# Patient Record
Sex: Male | Born: 1935 | Race: White | Hispanic: No | Marital: Single
Health system: Southern US, Community
[De-identification: ages and names within clinical notes are randomized; demographics above are authoritative.]

---

## 2013-06-03 LAB — CBC WITH DIFFERENTIAL/PLATELET
Eosinophil #: 0 10*3/uL (ref 0.0–0.7)
Eosinophil %: 0.6 %
HCT: 37.3 % — ABNORMAL LOW (ref 40.0–52.0)
MCHC: 34.6 g/dL (ref 32.0–36.0)
Platelet: 193 10*3/uL (ref 150–440)
RBC: 4.21 10*6/uL — ABNORMAL LOW (ref 4.40–5.90)
RDW: 12.9 % (ref 11.5–14.5)

## 2013-06-03 LAB — COMPREHENSIVE METABOLIC PANEL
Albumin: 4.5 g/dL (ref 3.4–5.0)
Alkaline Phosphatase: 66 U/L (ref 50–136)
Anion Gap: 5 — ABNORMAL LOW (ref 7–16)
Bilirubin,Total: 0.4 mg/dL (ref 0.2–1.0)
Co2: 28 mmol/L (ref 21–32)
Glucose: 129 mg/dL — ABNORMAL HIGH (ref 65–99)
Potassium: 4.6 mmol/L (ref 3.5–5.1)
Sodium: 135 mmol/L — ABNORMAL LOW (ref 136–145)
Total Protein: 7.2 g/dL (ref 6.4–8.2)

## 2013-06-03 LAB — ACETAMINOPHEN LEVEL: Acetaminophen: <2 ug/mL

## 2013-06-03 LAB — TROPONIN I: Troponin-I: <0.02 ng/mL

## 2013-06-04 ENCOUNTER — Inpatient Hospital Stay: Payer: Self-pay | Admitting: Internal Medicine

## 2013-06-04 LAB — URINALYSIS, COMPLETE
Blood: NEGATIVE
Glucose,UR: NEGATIVE mg/dL (ref 0–75)
Leukocyte Esterase: NEGATIVE
Nitrite: NEGATIVE
Ph: 7 (ref 4.5–8.0)
Protein: NEGATIVE
RBC,UR: 12 /HPF (ref 0–5)
Squamous Epithelial: 1
WBC UR: 6 /HPF (ref 0–5)

## 2013-06-04 LAB — DRUG SCREEN, URINE
Amphetamines, Ur Screen: NEGATIVE (ref ?–1000)
Barbiturates, Ur Screen: NEGATIVE (ref ?–200)
Opiate, Ur Screen: POSITIVE (ref ?–300)
Phencyclidine (PCP) Ur S: NEGATIVE (ref ?–25)

## 2013-06-06 LAB — BASIC METABOLIC PANEL
BUN: 19 mg/dL — ABNORMAL HIGH (ref 7–18)
Chloride: 97 mmol/L — ABNORMAL LOW (ref 98–107)
Co2: 27 mmol/L (ref 21–32)
EGFR (African American): >60
EGFR (Non-African Amer.): >60
Osmolality: 266 (ref 275–301)
Potassium: 3.7 mmol/L (ref 3.5–5.1)
Sodium: 130 mmol/L — ABNORMAL LOW (ref 136–145)

## 2013-06-06 LAB — CBC WITH DIFFERENTIAL/PLATELET
Basophil #: 0 10*3/uL (ref 0.0–0.1)
Basophil %: 0.5 %
Eosinophil %: 1.3 %
Lymphocyte #: 0.8 10*3/uL — ABNORMAL LOW (ref 1.0–3.6)
Lymphocyte %: 12.6 %
Monocyte #: 0.6 x10 3/mm (ref 0.2–1.0)
Neutrophil #: 4.6 10*3/uL (ref 1.4–6.5)
Neutrophil %: 75.6 %
Platelet: 155 10*3/uL (ref 150–440)
RBC: 4.42 10*6/uL (ref 4.40–5.90)
RDW: 12.7 % (ref 11.5–14.5)
WBC: 6 10*3/uL (ref 3.8–10.6)

## 2013-06-07 ENCOUNTER — Inpatient Hospital Stay: Payer: Self-pay | Admitting: Psychiatry

## 2013-06-08 LAB — CULTURE, BLOOD (SINGLE)

## 2013-06-15 LAB — VALPROIC ACID LEVEL: Valproic Acid: 9 ug/mL — ABNORMAL LOW

## 2014-04-27 IMAGING — CT CT HEAD WITHOUT CONTRAST
1 series · 16 of 30 positions shown, 20 images · non-contrast
Comparison: None.

CLINICAL DATA: 78-year- male with altered mental status. Initial
encounter.

EXAM:
CT HEAD WITHOUT CONTRAST
TECHNIQUE: Contiguous axial images were obtained from the base of the skull
through the vertex without intravenous contrast.

[Series 2: head wo · axial · 0.44mm/px · z∈[+1287,+1431]mm · 16 of 36 slices shown, 20 images]
[im 2/36  brain]
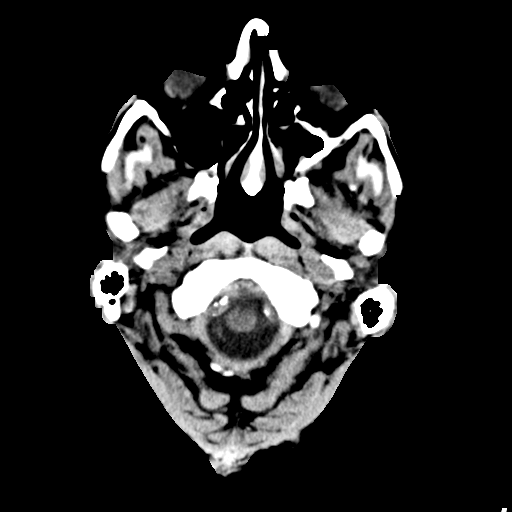
[im 2/36  bone]
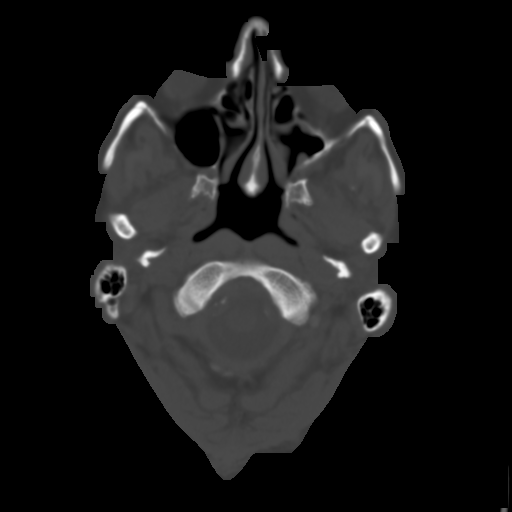
[im 4/36  brain]
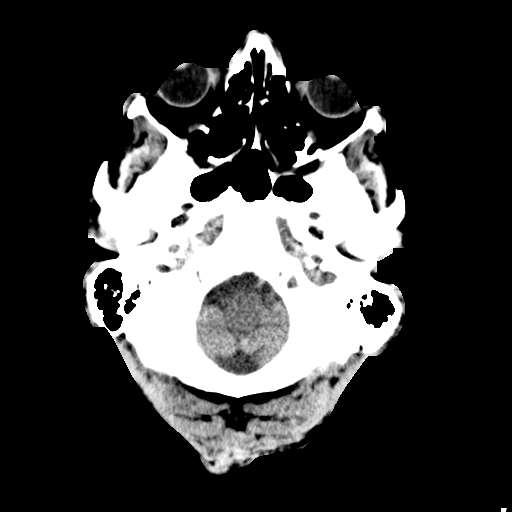
[im 7/36  brain]
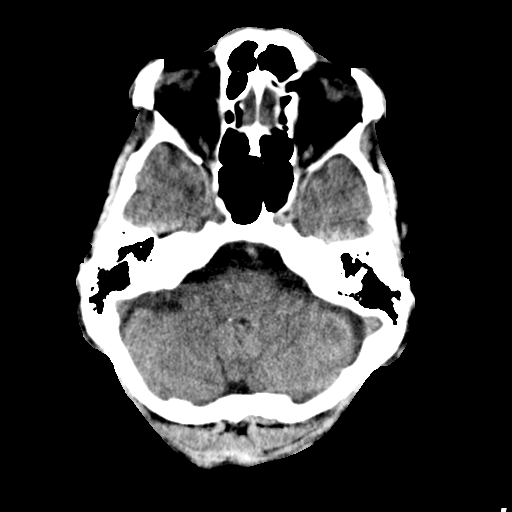
[im 9/36  brain]
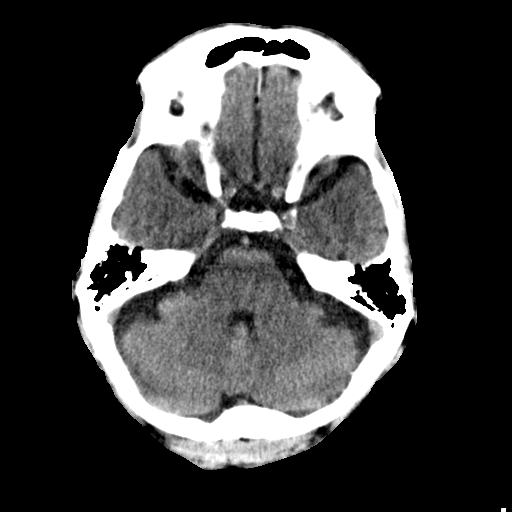
[im 10/36  brain]
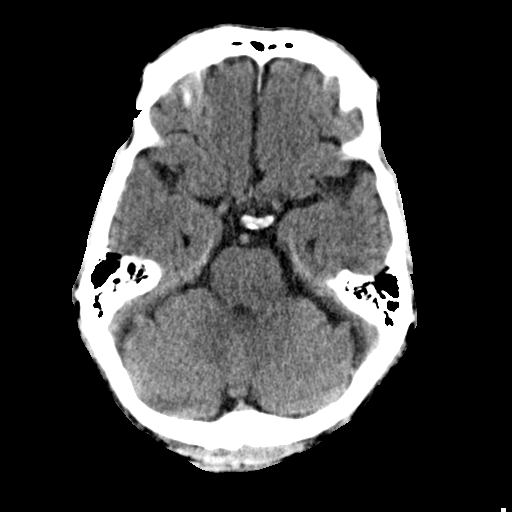
[im 10/36  bone]
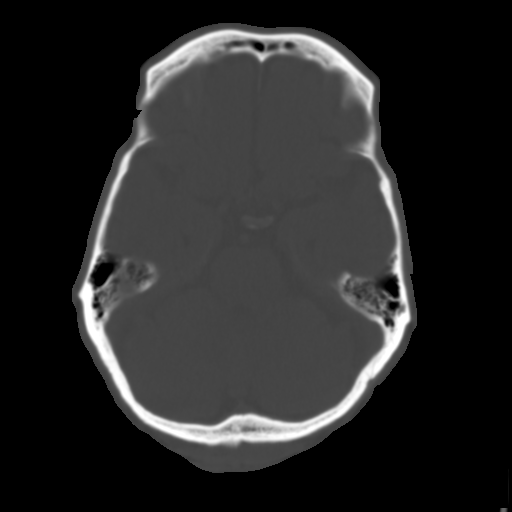
[im 13/36  brain]
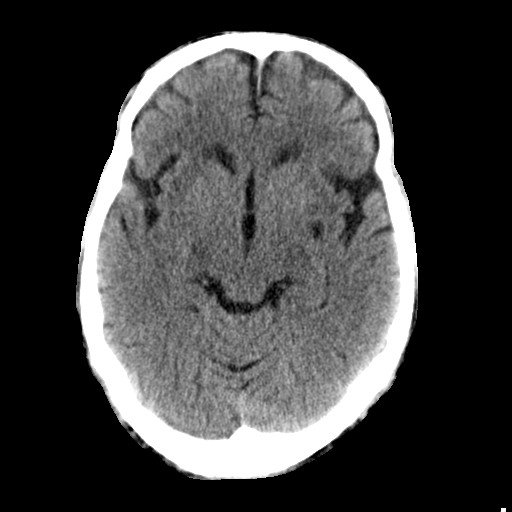
[im 15/36  brain]
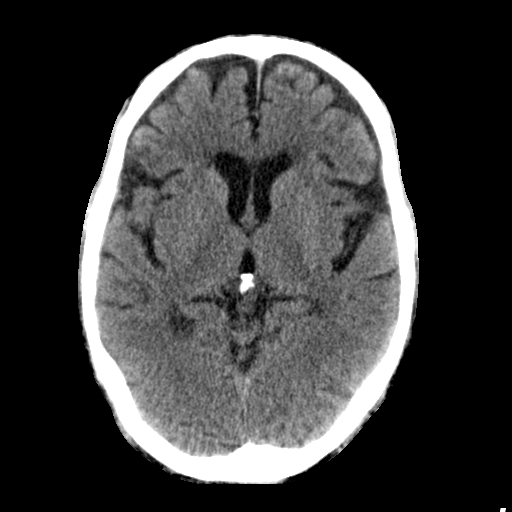
[im 17/36  brain]
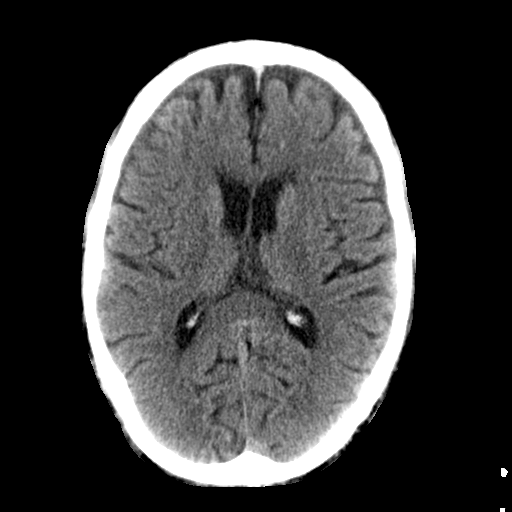
[im 19/36  brain]
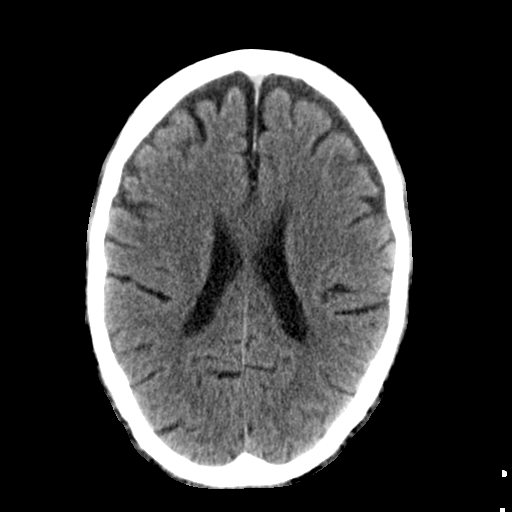
[im 19/36  bone]
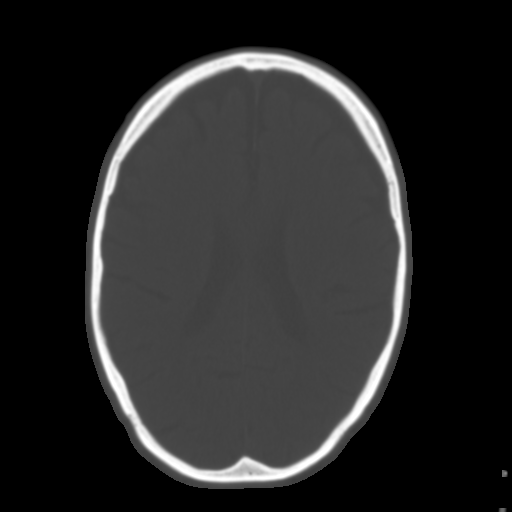
[im 21/36  brain]
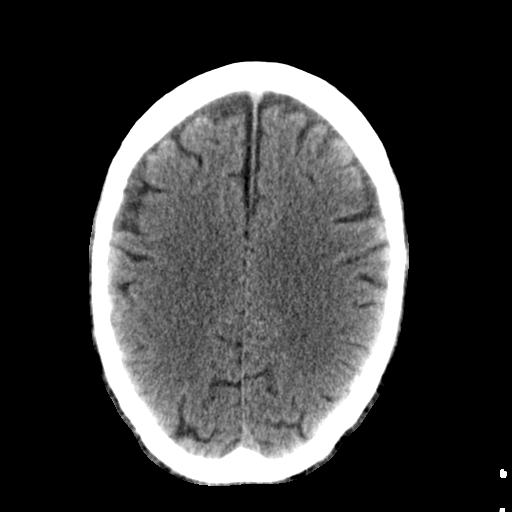
[im 23/36  brain]
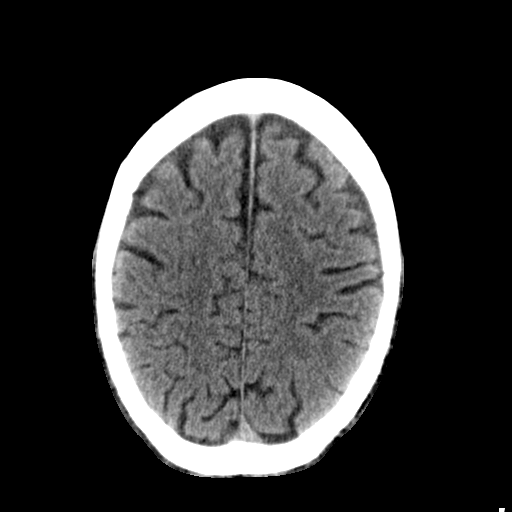
[im 26/36  brain]
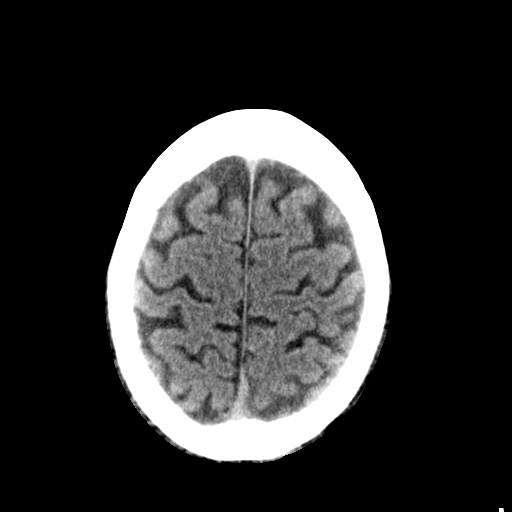
[im 27/36  brain]
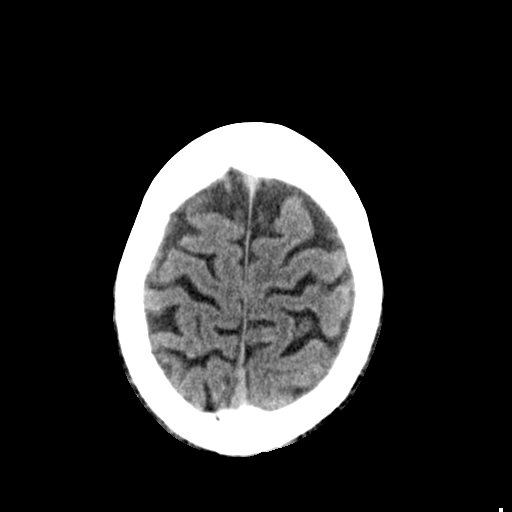
[im 27/36  bone]
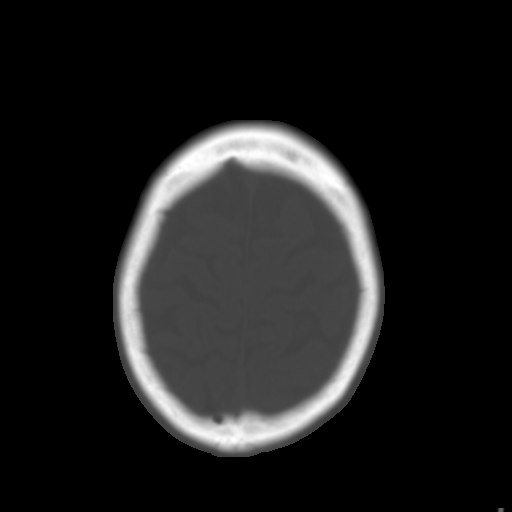
[im 29/36  brain]
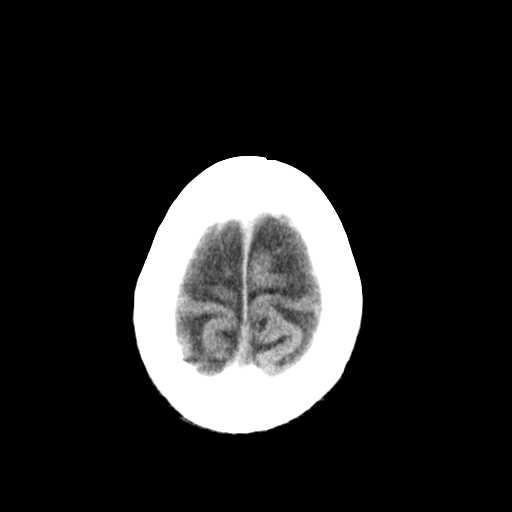
[im 32/36  brain]
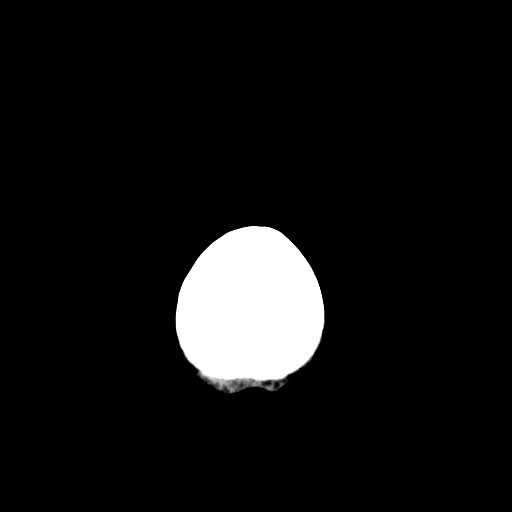
[im 34/36  brain]
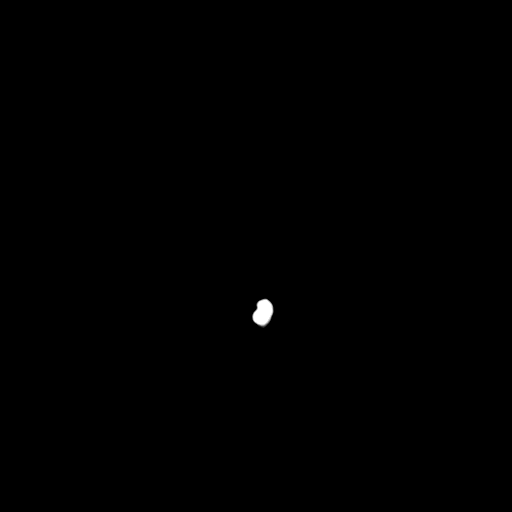

[16 of 30 positions shown; findings below may reference images not displayed]

FINDINGS: Evidence of left maxillary sinus mucoperiosteal thickening. Other
Visualized paranasal sinuses and mastoids are clear. No acute
osseous abnormality identified. Visualized orbits and scalp soft
tissues are within normal limits.

Mild Calcified atherosclerosis at the skull base. Cerebral volume is
within normal limits for age. No midline shift, ventriculomegaly,
mass effect, evidence of mass lesion, intracranial hemorrhage or
evidence of cortically based acute infarction. Gray-white matter
differentiation is within normal limits throughout the brain. Small
perivascular space incidentally noted at the inferior left basal
ganglia. No suspicious intracranial vascular hyperdensity.
IMPRESSION: Normal for age non contrast CT appearance of the brain.
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
## 2014-11-18 NOTE — Consult Note (Signed)
Brief Consult Note: Diagnosis: PSychosis nos.   Patient was seen by consultant.   Consult note dictated.   Recommend further assessment or treatment.   Orders entered.   Comments: Psychiatry: Patient seen. Patient has had 2 week decompensation of mental state capped by manic-like behavior. See full note. Orders done for risperdal, restart zoloft, and low-dose depakote.  Electronic Signatures: Audery Amellapacs, Governor Matos T (MD)  (Signed [id number]-Nov-14 15:35)  Authored: Brief Consult Note   Last Updated: 07-Nov-14 15:35 by Audery Amellapacs, Macee Venables T (MD)
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
## 2014-11-18 NOTE — Consult Note (Signed)
PsychiatrY: Patient seen, chart reviewed and spoke with Dr Kirtland BouchardK. Patient medically stable but still mentally confused, hyperrelirious and disorganized. In interview today affect was anxious and thoughts still a bit scattered. Oriented to hospital but not statte or town. Still believes his wife is "dying" which does not seem to be the case. [facility]. Patient still weak on his feet. BP and BG up. I counceled patient and he is agreeable to psych transfer. DX still psychosis nos ro bipolar disorder. Will order initiation of transfer. At this time he is cooperative so I will not initiate IVC unless it proves needed.  Electronic Signatures: Michael Jarvis, Jackquline DenmarkJohn T (MD)  (Signed on 10-Nov-14 12:57)  Authored  Last Updated: 10-Nov-14 12:57 by Audery Amellapacs, Zarinah Oviatt T (MD)

## 2016-06-28 DEATH — deceased
